# Patient Record
Sex: Female | Born: 1970 | Race: White | Hispanic: No | Smoking: Former smoker
Health system: Southern US, Community
[De-identification: ages and names within clinical notes are randomized; demographics above are authoritative.]

## PROBLEM LIST (undated history)

## (undated) DIAGNOSIS — J45909 Unspecified asthma, uncomplicated: Secondary | ICD-10-CM

## (undated) DIAGNOSIS — I10 Essential (primary) hypertension: Secondary | ICD-10-CM

## (undated) DIAGNOSIS — I340 Nonrheumatic mitral (valve) insufficiency: Secondary | ICD-10-CM

## (undated) DIAGNOSIS — E119 Type 2 diabetes mellitus without complications: Secondary | ICD-10-CM

## (undated) HISTORY — PX: EYE SURGERY: SHX253

---

## 2005-03-05 ENCOUNTER — Emergency Department (HOSPITAL_COMMUNITY): Admission: EM | Admit: 2005-03-05 | Discharge: 2005-03-06 | Payer: Self-pay | Admitting: Emergency Medicine

## 2007-08-15 IMAGING — CR DG KNEE COMPLETE 4+V*R*
4 series · 4 of 4 positions shown · non-contrast
Comparison: none

CLINICAL DATA: injury and pain
 RIGHT KNEE- 4 VIEW:

[view not recorded (1 of 4)]
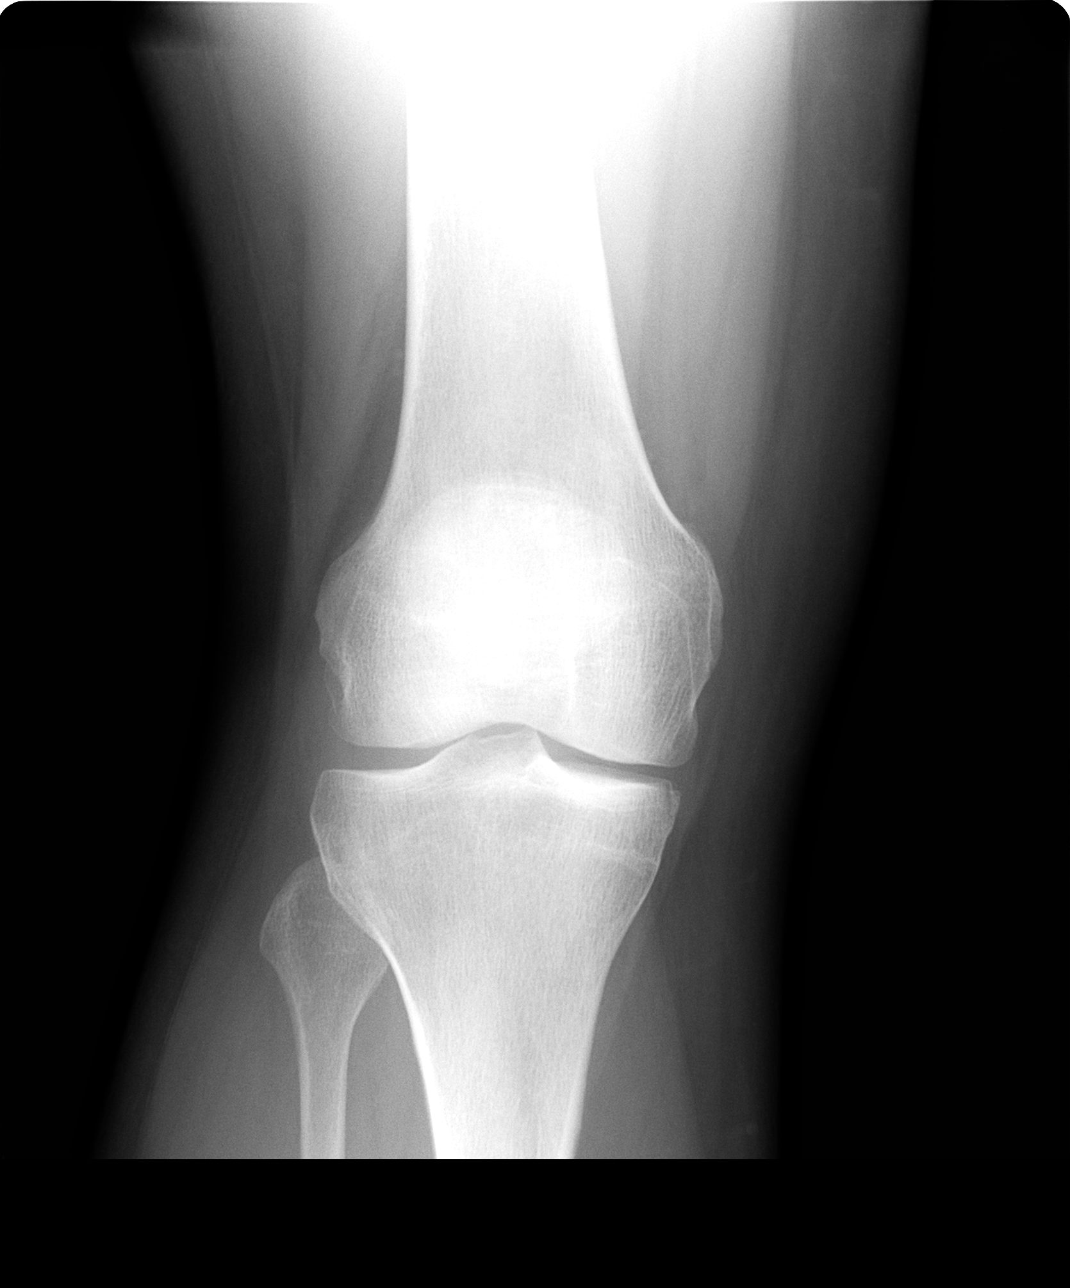

[view not recorded (2 of 4)]
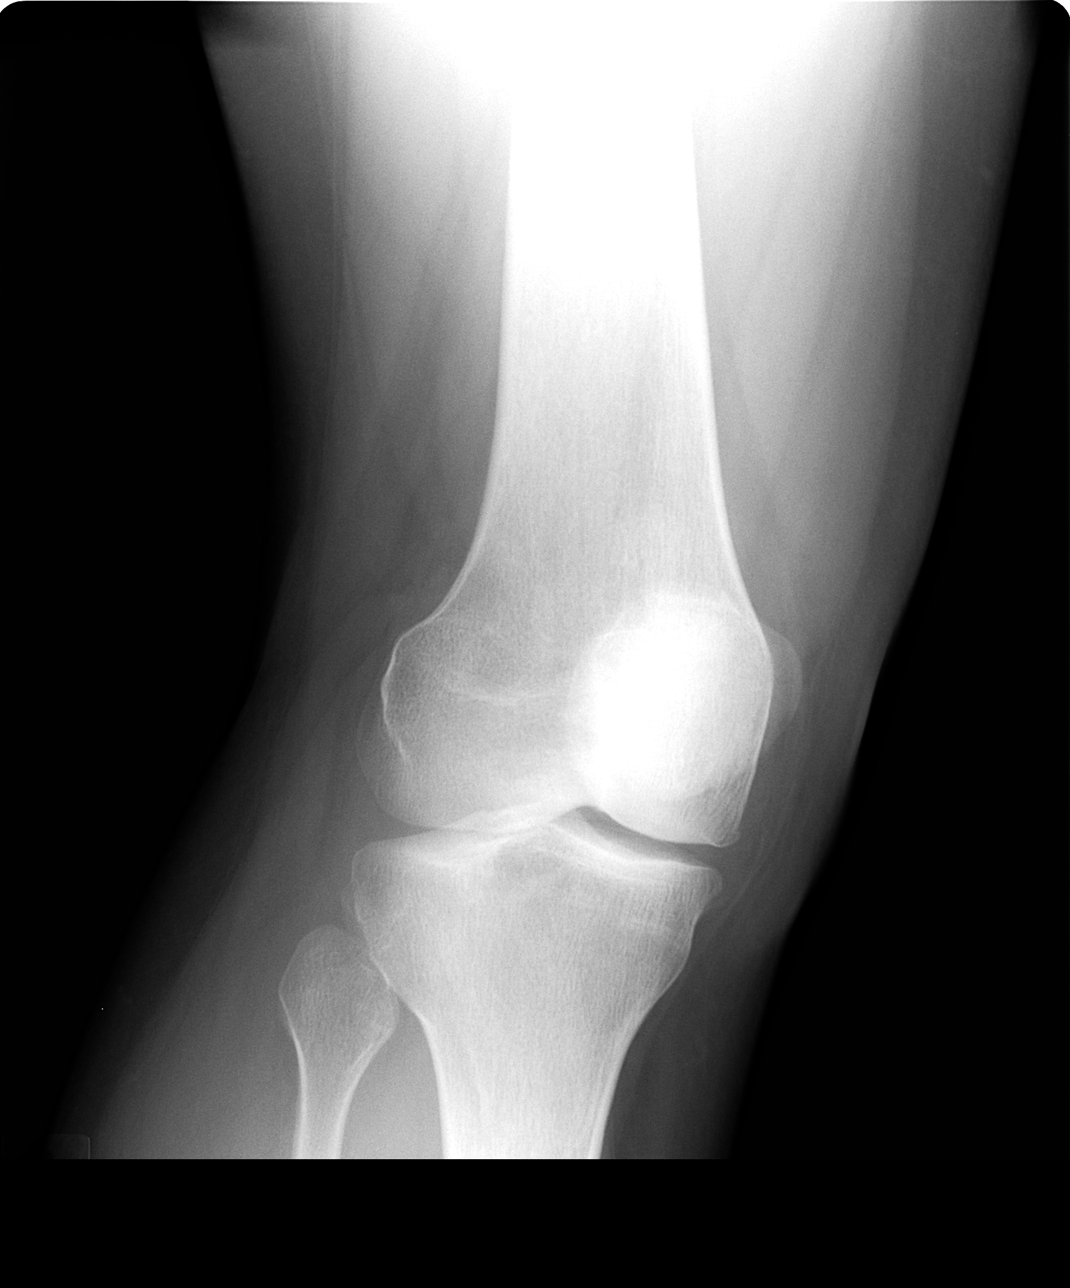

[view not recorded (3 of 4)]
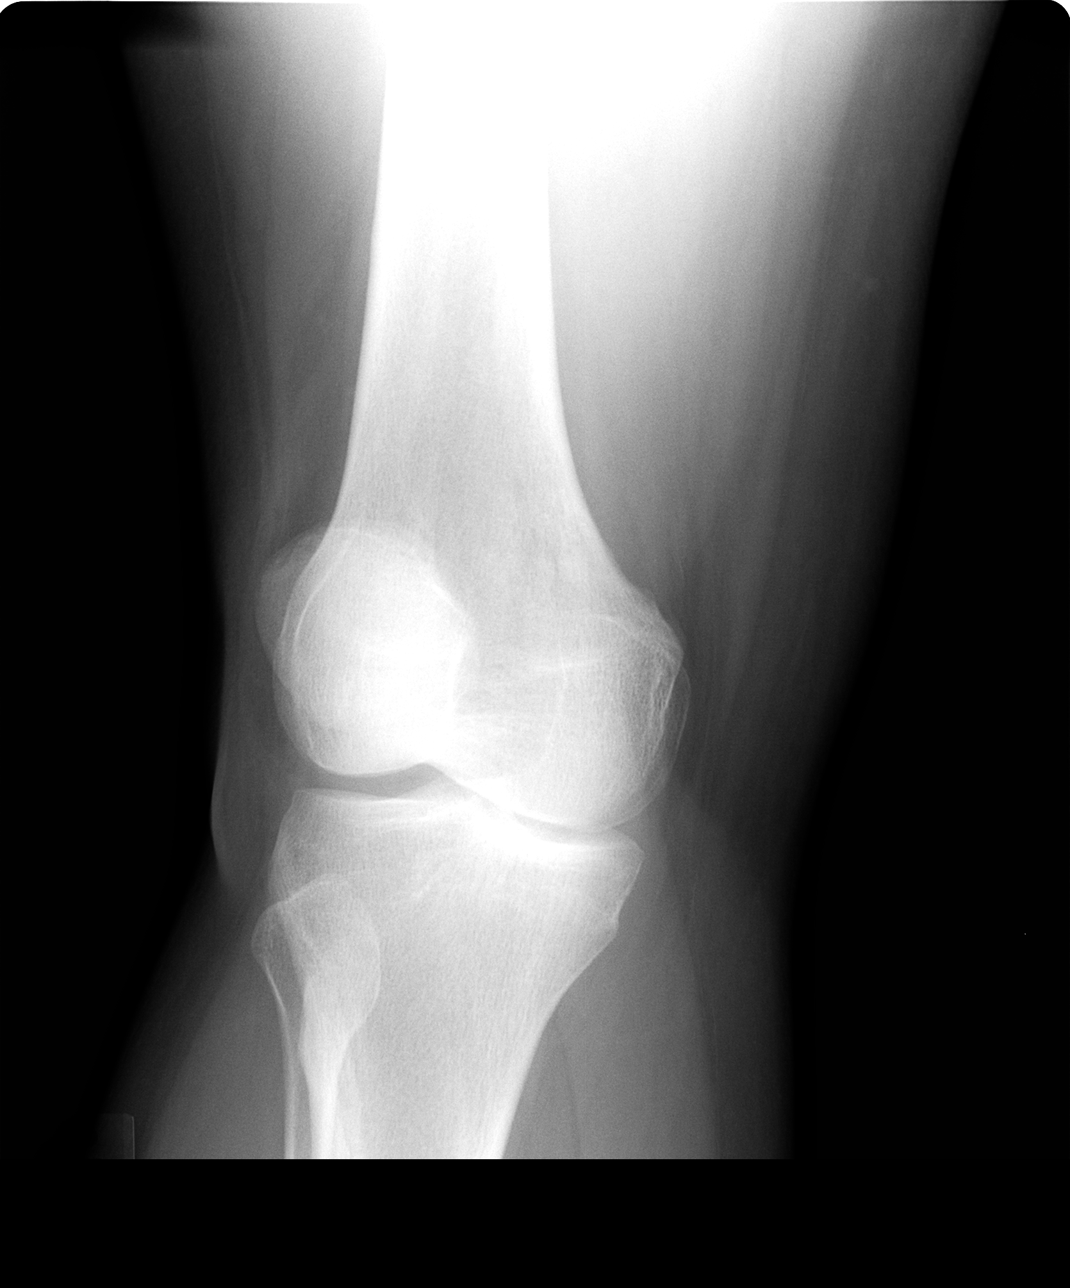

[view not recorded (4 of 4)]
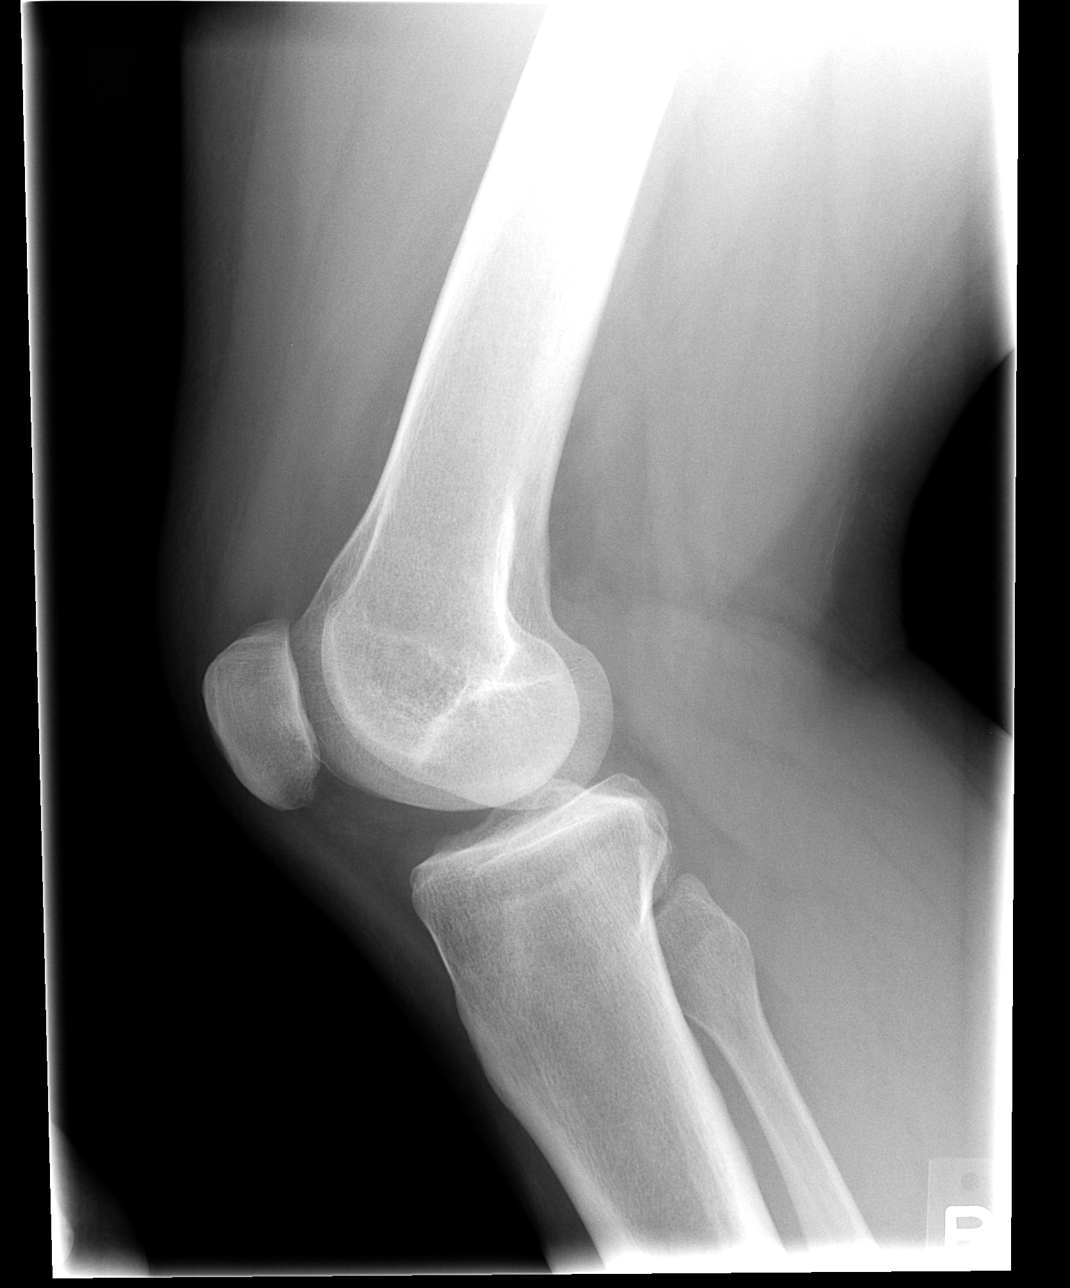

[4 of 4 positions shown; findings below may reference images not displayed]

FINDINGS: No fracture, dislocation or joint effusion.  There is some early degenerative change in the medial compartment.
IMPRESSION: See above.

## 2017-01-30 ENCOUNTER — Encounter
Admission: EM | Disposition: A | Discharge status: Home / Self Care | Payer: Self-pay | Source: Home / Self Care | Attending: Internal Medicine

## 2017-01-30 ENCOUNTER — Emergency Department: Payer: Self-pay

## 2017-01-30 ENCOUNTER — Other Ambulatory Visit: Payer: Self-pay

## 2017-01-30 ENCOUNTER — Inpatient Hospital Stay
Admission: EM | Admit: 2017-01-30 | Discharge: 2017-02-01 | DRG: 281 | Disposition: A | Discharge status: Home / Self Care | Payer: Self-pay | Attending: Internal Medicine | Admitting: Internal Medicine

## 2017-01-30 ENCOUNTER — Encounter: Payer: Self-pay | Admitting: Emergency Medicine

## 2017-01-30 DIAGNOSIS — I119 Hypertensive heart disease without heart failure: Secondary | ICD-10-CM | POA: Diagnosis present

## 2017-01-30 DIAGNOSIS — Z79899 Other long term (current) drug therapy: Secondary | ICD-10-CM

## 2017-01-30 DIAGNOSIS — Z91013 Allergy to seafood: Secondary | ICD-10-CM

## 2017-01-30 DIAGNOSIS — Z87891 Personal history of nicotine dependence: Secondary | ICD-10-CM

## 2017-01-30 DIAGNOSIS — J45909 Unspecified asthma, uncomplicated: Secondary | ICD-10-CM | POA: Diagnosis present

## 2017-01-30 DIAGNOSIS — Z833 Family history of diabetes mellitus: Secondary | ICD-10-CM

## 2017-01-30 DIAGNOSIS — I252 Old myocardial infarction: Secondary | ICD-10-CM

## 2017-01-30 DIAGNOSIS — Z6839 Body mass index (BMI) 39.0-39.9, adult: Secondary | ICD-10-CM

## 2017-01-30 DIAGNOSIS — I209 Angina pectoris, unspecified: Secondary | ICD-10-CM

## 2017-01-30 DIAGNOSIS — Z888 Allergy status to other drugs, medicaments and biological substances status: Secondary | ICD-10-CM

## 2017-01-30 DIAGNOSIS — Z91018 Allergy to other foods: Secondary | ICD-10-CM

## 2017-01-30 DIAGNOSIS — I214 Non-ST elevation (NSTEMI) myocardial infarction: Principal | ICD-10-CM | POA: Diagnosis present

## 2017-01-30 DIAGNOSIS — R739 Hyperglycemia, unspecified: Secondary | ICD-10-CM

## 2017-01-30 DIAGNOSIS — Z880 Allergy status to penicillin: Secondary | ICD-10-CM

## 2017-01-30 DIAGNOSIS — E785 Hyperlipidemia, unspecified: Secondary | ICD-10-CM | POA: Diagnosis present

## 2017-01-30 DIAGNOSIS — Z885 Allergy status to narcotic agent status: Secondary | ICD-10-CM

## 2017-01-30 DIAGNOSIS — Z8249 Family history of ischemic heart disease and other diseases of the circulatory system: Secondary | ICD-10-CM

## 2017-01-30 DIAGNOSIS — Z7982 Long term (current) use of aspirin: Secondary | ICD-10-CM

## 2017-01-30 DIAGNOSIS — E1165 Type 2 diabetes mellitus with hyperglycemia: Secondary | ICD-10-CM | POA: Diagnosis present

## 2017-01-30 DIAGNOSIS — I471 Supraventricular tachycardia: Secondary | ICD-10-CM | POA: Diagnosis present

## 2017-01-30 DIAGNOSIS — Z7984 Long term (current) use of oral hypoglycemic drugs: Secondary | ICD-10-CM

## 2017-01-30 HISTORY — DX: Essential (primary) hypertension: I10

## 2017-01-30 HISTORY — DX: Type 2 diabetes mellitus without complications: E11.9

## 2017-01-30 HISTORY — DX: Unspecified asthma, uncomplicated: J45.909

## 2017-01-30 HISTORY — DX: Nonrheumatic mitral (valve) insufficiency: I34.0

## 2017-01-30 HISTORY — PX: CORONARY ANGIOGRAPHY: CATH118303

## 2017-01-30 LAB — LIPID PANEL
Cholesterol: 190 mg/dL (ref 0–200)
HDL: 30 mg/dL — ABNORMAL LOW (ref >40)
LDL CALC: UNDETERMINED mg/dL (ref 0–99)
TRIGLYCERIDES: 433 mg/dL — AB (ref <150)
Total CHOL/HDL Ratio: 6.3 RATIO
VLDL: UNDETERMINED mg/dL (ref 0–40)

## 2017-01-30 LAB — BASIC METABOLIC PANEL
Anion gap: 10 (ref 5–15)
Anion gap: 8 (ref 5–15)
BUN: 18 mg/dL (ref 6–20)
BUN: 17 mg/dL (ref 6–20)
CALCIUM: 9.5 mg/dL (ref 8.9–10.3)
CHLORIDE: 103 mmol/L (ref 101–111)
CO2: 24 mmol/L (ref 22–32)
CO2: 20 mmol/L — ABNORMAL LOW (ref 22–32)
CREATININE: 0.86 mg/dL (ref 0.44–1.00)
Calcium: 8.8 mg/dL — ABNORMAL LOW (ref 8.9–10.3)
Chloride: 99 mmol/L — ABNORMAL LOW (ref 101–111)
Creatinine, Ser: 0.72 mg/dL (ref 0.44–1.00)
GFR calc Af Amer: >60 mL/min (ref >60)
GFR calc non Af Amer: >60 mL/min (ref >60)
GFR calc non Af Amer: >60 mL/min (ref >60)
Glucose, Bld: 407 mg/dL — ABNORMAL HIGH (ref 65–99)
Glucose, Bld: 418 mg/dL — ABNORMAL HIGH (ref 65–99)
POTASSIUM: 4.1 mmol/L (ref 3.5–5.1)
POTASSIUM: 4.5 mmol/L (ref 3.5–5.1)
SODIUM: 131 mmol/L — AB (ref 135–145)
Sodium: 133 mmol/L — ABNORMAL LOW (ref 135–145)

## 2017-01-30 LAB — TROPONIN I
TROPONIN I: 0.04 ng/mL — AB (ref <0.03)
Troponin I: 0.05 ng/mL (ref <0.03)

## 2017-01-30 LAB — CBC
HCT: 36.6 % (ref 35.0–47.0)
HEMATOCRIT: 34.9 % — AB (ref 35.0–47.0)
Hemoglobin: 11.4 g/dL — ABNORMAL LOW (ref 12.0–16.0)
Hemoglobin: 10.8 g/dL — ABNORMAL LOW (ref 12.0–16.0)
MCH: 20.9 pg — ABNORMAL LOW (ref 26.0–34.0)
MCH: 20.7 pg — ABNORMAL LOW (ref 26.0–34.0)
MCHC: 31.1 g/dL — ABNORMAL LOW (ref 32.0–36.0)
MCHC: 30.9 g/dL — ABNORMAL LOW (ref 32.0–36.0)
MCV: 67.4 fL — ABNORMAL LOW (ref 80.0–100.0)
MCV: 67 fL — AB (ref 80.0–100.0)
PLATELETS: 371 10*3/uL (ref 150–440)
Platelets: 323 10*3/uL (ref 150–440)
RBC: 5.43 MIL/uL — AB (ref 3.80–5.20)
RBC: 5.21 MIL/uL — ABNORMAL HIGH (ref 3.80–5.20)
RDW: 17.5 % — AB (ref 11.5–14.5)
RDW: 17.4 % — AB (ref 11.5–14.5)
WBC: 9.5 10*3/uL (ref 3.6–11.0)
WBC: 7.8 10*3/uL (ref 3.6–11.0)

## 2017-01-30 LAB — APTT: aPTT: 24 seconds (ref 24–36)

## 2017-01-30 LAB — GLUCOSE, CAPILLARY
GLUCOSE-CAPILLARY: 294 mg/dL — AB (ref 65–99)
GLUCOSE-CAPILLARY: 456 mg/dL — AB (ref 65–99)
Glucose-Capillary: 263 mg/dL — ABNORMAL HIGH (ref 65–99)
Glucose-Capillary: 359 mg/dL — ABNORMAL HIGH (ref 65–99)
Glucose-Capillary: 451 mg/dL — ABNORMAL HIGH (ref 65–99)

## 2017-01-30 LAB — PROTIME-INR
INR: 0.97
Prothrombin Time: 12.8 seconds (ref 11.4–15.2)

## 2017-01-30 LAB — POCT PREGNANCY, URINE: PREG TEST UR: NEGATIVE

## 2017-01-30 SURGERY — CORONARY ANGIOGRAPHY (CATH LAB)
Anesthesia: Moderate Sedation

## 2017-01-30 SURGERY — LEFT HEART CATH AND CORONARY ANGIOGRAPHY
Anesthesia: Moderate Sedation | Laterality: Right

## 2017-01-30 MED ORDER — INSULIN GLARGINE 100 UNIT/ML ~~LOC~~ SOLN
10.0000 [IU] | Freq: Every day | SUBCUTANEOUS | Status: DC
  Filled 2017-01-30 (×2): qty 0.1

## 2017-01-30 MED ORDER — DIPHENHYDRAMINE HCL 25 MG PO CAPS: 50.0000 mg | ORAL_CAPSULE | Freq: Two times a day (BID) | ORAL | Status: DC

## 2017-01-30 MED ORDER — ACETAMINOPHEN 325 MG PO TABS
650.0000 mg | ORAL_TABLET | ORAL | Status: DC | PRN
  Administered 2017-01-31: 650 mg via ORAL
  Filled 2017-01-30: qty 2

## 2017-01-30 MED ORDER — METHYLPREDNISOLONE SODIUM SUCC 125 MG IJ SOLR
80.0000 mg | Freq: Once | INTRAMUSCULAR | Status: AC
  Administered 2017-01-30: 125 mg via INTRAVENOUS

## 2017-01-30 MED ORDER — INSULIN ASPART 100 UNIT/ML ~~LOC~~ SOLN
0.0000 [IU] | Freq: Three times a day (TID) | SUBCUTANEOUS | Status: DC
  Administered 2017-01-31 (×2): 9 [IU] via SUBCUTANEOUS
  Filled 2017-01-30 (×2): qty 1

## 2017-01-30 MED ORDER — LABETALOL HCL 5 MG/ML IV SOLN
INTRAVENOUS | Status: AC
  Filled 2017-01-30: qty 4

## 2017-01-30 MED ORDER — SODIUM CHLORIDE 0.9 % IV BOLUS (SEPSIS)
1000.0000 mL | Freq: Once | INTRAVENOUS | Status: AC
  Administered 2017-01-30: 1000 mL via INTRAVENOUS

## 2017-01-30 MED ORDER — HEPARIN (PORCINE) IN NACL 2-0.9 UNIT/ML-% IJ SOLN
INTRAMUSCULAR | Status: AC
  Filled 2017-01-30: qty 500

## 2017-01-30 MED ORDER — SODIUM CHLORIDE 0.9 % WEIGHT BASED INFUSION: 1.0000 mL/kg/h | INTRAVENOUS | Status: DC

## 2017-01-30 MED ORDER — INSULIN GLARGINE 100 UNIT/ML ~~LOC~~ SOLN
10.0000 [IU] | Freq: Every day | SUBCUTANEOUS | Status: DC
  Administered 2017-01-30: 10 [IU] via SUBCUTANEOUS
  Filled 2017-01-30 (×2): qty 0.1

## 2017-01-30 MED ORDER — NITROGLYCERIN 0.4 MG SL SUBL: 0.4000 mg | SUBLINGUAL_TABLET | SUBLINGUAL | Status: DC | PRN

## 2017-01-30 MED ORDER — SODIUM CHLORIDE 0.9 % WEIGHT BASED INFUSION: 3.0000 mL/kg/h | INTRAVENOUS | Status: DC

## 2017-01-30 MED ORDER — FAMOTIDINE 20 MG PO TABS
ORAL_TABLET | ORAL | Status: AC
  Administered 2017-01-30: 20 mg
  Filled 2017-01-30: qty 1

## 2017-01-30 MED ORDER — ASPIRIN 81 MG PO CHEW: 81.0000 mg | CHEWABLE_TABLET | ORAL | Status: DC

## 2017-01-30 MED ORDER — DIPHENHYDRAMINE HCL 50 MG/ML IJ SOLN
INTRAMUSCULAR | Status: AC
  Administered 2017-01-30: 50 mg
  Filled 2017-01-30: qty 1

## 2017-01-30 MED ORDER — LABETALOL HCL 5 MG/ML IV SOLN
INTRAVENOUS | Status: DC | PRN
  Administered 2017-01-30 (×2): 20 mg via INTRAVENOUS

## 2017-01-30 MED ORDER — FAMOTIDINE 20 MG PO TABS
20.0000 mg | ORAL_TABLET | Freq: Once | ORAL | Status: DC
  Filled 2017-01-30: qty 1

## 2017-01-30 MED ORDER — NITROGLYCERIN 0.4 MG SL SUBL
0.4000 mg | SUBLINGUAL_TABLET | SUBLINGUAL | Status: DC | PRN
  Administered 2017-01-30: 0.4 mg via SUBLINGUAL

## 2017-01-30 MED ORDER — MIDAZOLAM HCL 2 MG/2ML IJ SOLN
INTRAMUSCULAR | Status: DC | PRN
  Administered 2017-01-30: 1 mg via INTRAVENOUS

## 2017-01-30 MED ORDER — INSULIN REGULAR HUMAN 100 UNIT/ML IJ SOLN
5.0000 [IU] | Freq: Once | INTRAMUSCULAR | Status: AC
  Administered 2017-01-30: 5 [IU] via INTRAVENOUS
  Filled 2017-01-30: qty 0.05

## 2017-01-30 MED ORDER — KETOROLAC TROMETHAMINE 30 MG/ML IJ SOLN
30.0000 mg | Freq: Three times a day (TID) | INTRAMUSCULAR | Status: DC | PRN
  Administered 2017-01-30 – 2017-02-01 (×3): 30 mg via INTRAVENOUS
  Filled 2017-01-30 (×3): qty 1

## 2017-01-30 MED ORDER — METOPROLOL TARTRATE 5 MG/5ML IV SOLN
5.0000 mg | Freq: Once | INTRAVENOUS | Status: AC
  Administered 2017-01-30: 5 mg via INTRAVENOUS
  Filled 2017-01-30: qty 5

## 2017-01-30 MED ORDER — INSULIN REGULAR HUMAN 100 UNIT/ML IJ SOLN
10.0000 [IU] | Freq: Once | INTRAMUSCULAR | Status: AC
  Administered 2017-01-31: 10 [IU] via INTRAVENOUS
  Filled 2017-01-30: qty 0.1

## 2017-01-30 MED ORDER — ASPIRIN EC 81 MG PO TBEC
81.0000 mg | DELAYED_RELEASE_TABLET | Freq: Every day | ORAL | Status: DC
  Administered 2017-01-31 – 2017-02-01 (×2): 81 mg via ORAL
  Filled 2017-01-30 (×2): qty 1

## 2017-01-30 MED ORDER — INSULIN ASPART 100 UNIT/ML ~~LOC~~ SOLN
0.0000 [IU] | Freq: Three times a day (TID) | SUBCUTANEOUS | Status: DC
  Administered 2017-01-30: 5 [IU] via SUBCUTANEOUS
  Filled 2017-01-30: qty 1

## 2017-01-30 MED ORDER — SODIUM CHLORIDE 0.9 % IV SOLN: 250.0000 mL | INTRAVENOUS | Status: DC | PRN

## 2017-01-30 MED ORDER — FENTANYL CITRATE (PF) 100 MCG/2ML IJ SOLN
INTRAMUSCULAR | Status: AC
  Filled 2017-01-30: qty 2

## 2017-01-30 MED ORDER — METOPROLOL TARTRATE 50 MG PO TABS: 100.0000 mg | ORAL_TABLET | Freq: Two times a day (BID) | ORAL | Status: DC

## 2017-01-30 MED ORDER — HEPARIN BOLUS VIA INFUSION
4000.0000 [IU] | Freq: Once | INTRAVENOUS | Status: AC
  Administered 2017-01-30: 4000 [IU] via INTRAVENOUS
  Filled 2017-01-30: qty 4000

## 2017-01-30 MED ORDER — DOCUSATE SODIUM 100 MG PO CAPS: 100.0000 mg | ORAL_CAPSULE | Freq: Two times a day (BID) | ORAL | Status: DC | PRN

## 2017-01-30 MED ORDER — LIDOCAINE HCL (PF) 1 % IJ SOLN
INTRAMUSCULAR | Status: AC
  Filled 2017-01-30: qty 30

## 2017-01-30 MED ORDER — METHYLPREDNISOLONE SODIUM SUCC 125 MG IJ SOLR
INTRAMUSCULAR | Status: AC
  Filled 2017-01-30: qty 2

## 2017-01-30 MED ORDER — LIDOCAINE HCL (PF) 1 % IJ SOLN
INTRAMUSCULAR | Status: DC | PRN
  Administered 2017-01-30: 20 mL via INTRADERMAL

## 2017-01-30 MED ORDER — HEPARIN SODIUM (PORCINE) 5000 UNIT/ML IJ SOLN: 4000.0000 [IU] | Freq: Once | INTRAMUSCULAR | Status: DC

## 2017-01-30 MED ORDER — NITROGLYCERIN 0.4 MG SL SUBL
SUBLINGUAL_TABLET | SUBLINGUAL | Status: AC
  Administered 2017-01-30: 0.4 mg via SUBLINGUAL
  Filled 2017-01-30: qty 1

## 2017-01-30 MED ORDER — INSULIN GLARGINE 100 UNIT/ML ~~LOC~~ SOLN
10.0000 [IU] | Freq: Every day | SUBCUTANEOUS | Status: DC
  Filled 2017-01-30: qty 0.1

## 2017-01-30 MED ORDER — LISINOPRIL 10 MG PO TABS: 10.0000 mg | ORAL_TABLET | Freq: Every day | ORAL | Status: DC

## 2017-01-30 MED ORDER — FENTANYL CITRATE (PF) 100 MCG/2ML IJ SOLN
INTRAMUSCULAR | Status: DC | PRN
  Administered 2017-01-30: 25 ug via INTRAVENOUS

## 2017-01-30 MED ORDER — IOPAMIDOL (ISOVUE-300) INJECTION 61%
INTRAVENOUS | Status: DC | PRN
  Administered 2017-01-30: 110 mL via INTRA_ARTERIAL

## 2017-01-30 MED ORDER — ASPIRIN 81 MG PO CHEW
324.0000 mg | CHEWABLE_TABLET | Freq: Once | ORAL | Status: AC
  Administered 2017-01-30: 324 mg via ORAL

## 2017-01-30 MED ORDER — DIPHENHYDRAMINE HCL 50 MG/ML IJ SOLN: 50.0000 mg | Freq: Once | INTRAMUSCULAR | Status: DC

## 2017-01-30 MED ORDER — LABETALOL HCL 200 MG PO TABS
200.0000 mg | ORAL_TABLET | Freq: Two times a day (BID) | ORAL | Status: DC
  Administered 2017-01-30 – 2017-02-01 (×4): 200 mg via ORAL
  Filled 2017-01-30 (×6): qty 1

## 2017-01-30 MED ORDER — MIDAZOLAM HCL 2 MG/2ML IJ SOLN
INTRAMUSCULAR | Status: AC
  Filled 2017-01-30: qty 2

## 2017-01-30 MED ORDER — ONDANSETRON HCL 4 MG/2ML IJ SOLN: 4.0000 mg | Freq: Four times a day (QID) | INTRAMUSCULAR | Status: DC | PRN

## 2017-01-30 MED ORDER — ASPIRIN 81 MG PO CHEW
CHEWABLE_TABLET | ORAL | Status: AC
  Filled 2017-01-30: qty 4

## 2017-01-30 MED ORDER — SODIUM CHLORIDE 0.9% FLUSH: 3.0000 mL | INTRAVENOUS | Status: DC | PRN

## 2017-01-30 MED ORDER — SODIUM CHLORIDE 0.9% FLUSH: 3.0000 mL | Freq: Two times a day (BID) | INTRAVENOUS | Status: DC

## 2017-01-30 MED ORDER — SODIUM CHLORIDE 0.9% FLUSH
3.0000 mL | Freq: Two times a day (BID) | INTRAVENOUS | Status: DC
  Administered 2017-01-31 – 2017-02-01 (×3): 3 mL via INTRAVENOUS

## 2017-01-30 MED ORDER — HEPARIN (PORCINE) IN NACL 100-0.45 UNIT/ML-% IJ SOLN
1250.0000 [IU]/h | INTRAMUSCULAR | Status: DC
  Administered 2017-01-30: 1250 [IU]/h via INTRAVENOUS
  Filled 2017-01-30 (×2): qty 250

## 2017-01-30 SURGICAL SUPPLY — 9 items
CATH 5FR JR4 DIAGNOSTIC (CATHETERS) ×2 IMPLANT
CATH INFINITI 5FR ANG PIGTAIL (CATHETERS) ×2 IMPLANT
CATH INFINITI 5FR JL4 (CATHETERS) ×2 IMPLANT
DEVICE CLOSURE MYNXGRIP 5F (Vascular Products) ×3 IMPLANT
KIT MANI 3VAL PERCEP (MISCELLANEOUS) ×3 IMPLANT
NEEDLE PERC 18GX7CM (NEEDLE) ×3 IMPLANT
PACK CARDIAC CATH (CUSTOM PROCEDURE TRAY) ×3 IMPLANT
SHEATH PINNACLE 5F 10CM (SHEATH) ×2 IMPLANT
WIRE EMERALD 3MM-J .035X150CM (WIRE) ×2 IMPLANT

## 2017-01-30 NOTE — Progress Notes (Signed)
Normal coronaries with hypertensive heart disease and will add lisinopril and change metoprolol to labetolol.

## 2017-01-30 NOTE — ED Provider Notes (Signed)
[facility] Emergency Department Provider Note  ____________________________________________  Time seen: Approximately 12:37 PM  I have reviewed the triage vital signs and the nursing notes.   HISTORY  Chief Complaint Chest Pain and Headache    HPI Emily Baxter is a 46 y.o. female with a history of SVT, CAD status post MI, medication noncompliance, HTN, DM, presenting with chest pain.  The patient reports that she was ambulating up the steps holding a small child when she developed heart racing, with a sharp "squeezing" left-sided chest pain which radiated down her arm and up the back of her neck associated with shortness of breath, nausea and vomiting, diaphoresis and dizziness.  EMS was called and the patient was found to be in SVT with a heart rate in the 180s, which slowed down to the low 100s after 6 mg of adenosine.  At this time, the patient continues to have some mild chest discomfort, but it is "much better" than earlier.  This feels similar to the patient's previous MI.  The patient underwent cardiac catheterization in 10/17 prior MI, but did not receive any angioplasty or stenting.  She has not had any of her medications in over a month.  Past Medical History:  Diagnosis Date  . Asthma   . Diabetes mellitus without complication (HCC)   . Hypertension   . MI (mitral incompetence)     There are no active problems to display for this patient.   Past Surgical History:  Procedure Laterality Date  . CESAREAN SECTION    . EYE SURGERY Right       Allergies Coconut oil; Iodine; Keflex [cephalexin]; Penicillins; Shellfish allergy; and Tegretol [carbamazepine]  No family history on file.  Social History Social History   Tobacco Use  . Smoking status: Former Smoker    Last attempt to quit: 07/30/2016    Years since quitting: 0.5  Substance Use Topics  . Alcohol use: No    Frequency: Never  . Drug use: No    Review of  Systems Constitutional: No fever/chills.  Positive lightheadedness and dizziness.  No syncope. Eyes: No visual changes. ENT: No sore throat. No congestion or rhinorrhea. Cardiovascular: +chest pain. +palpitations. Respiratory: +shortness of breath.  No cough. Gastrointestinal: No abdominal pain.  +nausea, +vomiting.  No diarrhea.  No constipation. Genitourinary: Negative for dysuria. Musculoskeletal: Negative for back pain. Skin: Negative for rash. Neurological: Negative for headaches. No focal numbness, tingling or weakness.  Endocrine: + hyperglycemia    ____________________________________________   PHYSICAL EXAM:  VITAL SIGNS: ED Triage Vitals  Enc Vitals Group     BP 01/30/17 1216 (!) 181/102     Pulse Rate 01/30/17 1216 (!) 111     Resp 01/30/17 1216 20     Temp 01/30/17 1216 98.8 F (37.1 C)     Temp Source 01/30/17 1216 Oral     SpO2 01/30/17 1216 98 %     Weight 01/30/17 1217 267 lb (121.1 kg)     Height 01/30/17 1217 5\' 9"  (1.753 m)     Head Circumference --      Peak Flow --      Pain Score 01/30/17 1216 6     Pain Loc --      Pain Edu? --      Excl. in GC? --     Constitutional: Alert and oriented. Well appearing and in no acute distress. Answers questions appropriately. Eyes: Conjunctivae are normal.  EOMI. No scleral icterus.  Positive disconjugate gaze.  Head: Atraumatic. Nose: No congestion/rhinnorhea. Mouth/Throat: Mucous membranes are dry..  Neck: No stridor.  Supple.  No JVD. Cardiovascular: Normal rate, regular rhythm. No murmurs, rubs or gallops.  Respiratory: Normal respiratory effort.  No accessory muscle use or retractions. Lungs CTAB.  No wheezes, rales or ronchi. Gastrointestinal: Obese.  Soft, nontender and nondistended.  No guarding or rebound.  No peritoneal signs. Musculoskeletal: No LE edema. No ttp in the calves or palpable cords.  Negative Homan's sign. Neurologic:  A&Ox3.  Speech is clear.  Face and smile are symmetric.  EOMI.  Moves  all extremities well. Skin:  Skin is warm, dry and intact. No rash noted. Psychiatric: Mood and affect are normal. Speech and behavior are normal.  Normal judgement  ____________________________________________   LABS (all labs ordered are listed, but only abnormal results are displayed)  Labs Reviewed  BASIC METABOLIC PANEL - Abnormal; Notable for the following components:      Result Value   Sodium 133 (*)    Chloride 99 (*)    Glucose, Bld 407 (*)    All other components within normal limits  CBC - Abnormal; Notable for the following components:   RBC 5.43 (*)    Hemoglobin 11.4 (*)    MCV 67.4 (*)    MCH 20.9 (*)    MCHC 31.1 (*)    RDW 17.5 (*)    All other components within normal limits  TROPONIN I - Abnormal; Notable for the following components:   Troponin I 0.04 (*)    All other components within normal limits  GLUCOSE, CAPILLARY - Abnormal; Notable for the following components:   Glucose-Capillary 359 (*)    All other components within normal limits  PROTIME-INR  APTT  HEPARIN LEVEL (UNFRACTIONATED)  POC URINE PREG, ED  POCT PREGNANCY, URINE   ____________________________________________  EKG  ED ECG REPORT I, Rockne MenghiniNorman, Anne-Caroline, the attending physician, personally viewed and interpreted this ECG.   Date: 01/30/2017  EKG Time: 1213  Rate: 113  Rhythm: sinus tachycardia  Axis: normal  Intervals:none  ST&T Change: 1mm ST elevation V2, 0.165mm ST elevation V1. No STEMI but morphology is concerning for ischemia.    This EKG is compared to EMS tracing, which does show almost 3 mm ST elevation in V2, 2 mm ST elevation in V3, 1 mm ST elevation in V1.  This tracing was obtained prior to adenosine administration with tachycardia in the 180s.  Repeat EKG: ED ECG REPORT I, Rockne MenghiniNorman, Anne-Caroline, the attending physician, personally viewed and interpreted this ECG no STEMI.   Date: 01/30/2017  EKG Time: 1238  Rate: 113  Rhythm: sinus tachycardia  Axis:  normal  Intervals:none  ST&T Change: 1 mm ST elevation in V1, V2 and V3.  No STEMI.   ____________________________________________  RADIOLOGY  No results found.  ____________________________________________   PROCEDURES  Procedure(s) performed: None  Procedures  Critical Care performed: Yes, see critical care note(s) ____________________________________________   INITIAL IMPRESSION / ASSESSMENT AND PLAN / ED COURSE  Pertinent labs & imaging results that were available during my care of the patient were reviewed by me and considered in my medical decision making (see chart for details).  46 y.o. female with a history of CAD status post MI, SVT, presenting with chest pain, palpitations, in the setting of rapid heart rate.  Overall, I am concerned about multiple possible etiologies.  It is possible the patient went into SVT because she has been off of her medications, and that this resulted in some  strain from demand ischemia resulting in the ST elevation that I am seeing on the EMS strips.  Here, the patient also does have some ST elevation but it does not meet criteria for STEMI.  However, given her history and her ongoing symptoms, I am concerned about ACS or MI.  She will immediately be given aspirin, metoprolol, and heparinized.  The on-call cardiologist has been immediately consulted.  The patient understands my concerns, and will continue to monitor her.  I reviewed the patient's medical chart.  ----------------------------------------- 1:23 PM on 01/30/2017 -----------------------------------------  The patient has been seen by Dr. Lennette Bihari, who is also concerned about ischemia, and will plan to have cardiac catheterization today although she does not meet criteria to be taken to the catheterization lab emergently as she is not having a STEMI.  The patient has received aspirin, metoprolol and heparin and remains in stable condition.  She has a elevated troponin of 0.04 at this  time, the patient will be admitted for further evaluation and treatment.  CRITICAL CARE Performed by: Rockne Menghini   Total critical care time: 45 minutes  Critical care time was exclusive of separately billable procedures and treating other patients.  Critical care was necessary to treat or prevent imminent or life-threatening deterioration.  Critical care was time spent personally by me on the following activities: development of treatment plan with patient and/or surrogate as well as nursing, discussions with consultants, evaluation of patient's response to treatment, examination of patient, obtaining history from patient or surrogate, ordering and performing treatments and interventions, ordering and review of laboratory studies, ordering and review of radiographic studies, pulse oximetry and re-evaluation of patient's condition.   ____________________________________________  FINAL CLINICAL IMPRESSION(S) / ED DIAGNOSES  Final diagnoses:  SVT (supraventricular tachycardia) (HCC)  NSTEMI (non-ST elevated myocardial infarction) (HCC)  Hyperglycemia         NEW MEDICATIONS STARTED DURING THIS VISIT:  This SmartLink is deprecated. Use AVSMEDLIST instead to display the medication list for a patient.'    Rockne Menghini, MD 01/30/17 1324

## 2017-01-30 NOTE — Progress Notes (Signed)
ANTICOAGULATION CONSULT NOTE - Initial Consult  Pharmacy Consult for heparin drip Indication: chest pain/ACS  Allergies  Allergen Reactions  . Coconut Oil Anaphylaxis  . Iodine Itching  . Keflex [Cephalexin] Itching  . Penicillins Other (See Comments)    Unknown reaction  . Shellfish Allergy Itching  . Tegretol [Carbamazepine] Other (See Comments)    "skin started falling off"    Patient Measurements: Height: 5\' 9"  (175.3 cm) Weight: 267 lb (121.1 kg) IBW/kg (Calculated) : 66.2 Heparin Dosing Weight: 94 kg  Vital Signs: Temp: 98.8 F (37.1 C) (11/29 1216) Temp Source: Oral (11/29 1216) BP: 181/102 (11/29 1216) Pulse Rate: 111 (11/29 1216)  Labs: Recent Labs    01/30/17 1227  HGB 11.4*  HCT 36.6  PLT 371    CrCl cannot be calculated (No order found.).   Medical History: Past Medical History:  Diagnosis Date  . Asthma   . Diabetes mellitus without complication (HCC)   . Hypertension   . MI (mitral incompetence)    Assessment: Pharmacy consult entered to dose and monitor heparin in this 46 year old female for ACS. Baseline labs have been ordered. Patient was not on anticoagulants prior to admission.  Goal of Therapy:  Heparin level 0.3-0.7 units/ml Monitor platelets by anticoagulation protocol: Yes   Plan:  Give 4000 units bolus x 1 Start heparin infusion at 1250 units/hr Check anti-Xa level in 6 hours and daily while on heparin Continue to monitor H&H and platelets  Cindi CarbonMary M Brianca Fortenberry, PharmD, BCPS Clinical Pharmacist 01/30/2017,1:00 PM
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
## 2017-01-30 NOTE — Progress Notes (Signed)
Patient admitted to unit. Oriented to room, call bell, and staff. Bed in lowest position. Fall safety plan reviewed. Full assessment to Epic. Skin assessment verified with Serenity K. RN. Telemetry box verification with tele clerk and Melton Alarierra NT- Box#: [id number]. CAth site stable. Able to sit up at 1730 and get out of bed after 1830 per Avery DennisonBrooke RN, Specials. Will continue to monitor.

## 2017-01-30 NOTE — Consult Note (Signed)
Wilhemena DurieLarissa Torti is a 46 y.o. female  [id number]  Primary Cardiologist: Shia Eber Reason for Consultation:NSTEMI  HPI: [id number] presented with SVT and got adenosine and now in NSR. But had dynamic st and changes associated with chest pai radiating to neck   Review of Systems: NO PND/syncope  Past Medical History:  Diagnosis Date  . Asthma   . Diabetes mellitus without complication (HCC)   . Hypertension   . MI (mitral incompetence)      (Not in a hospital admission)   . diphenhydrAMINE  50 mg Oral q12n4p    Infusions: . heparin 1,250 Units/hr (01/30/17 1310)    Allergies  Allergen Reactions  . Coconut Oil Anaphylaxis  . Iodine Itching  . Keflex [Cephalexin] Itching  . Penicillins Other (See Comments)    Unknown reaction  . Shellfish Allergy Itching  . Tegretol [Carbamazepine] Other (See Comments)    "skin started falling off"    Social History   Socioeconomic History  . Marital status: Divorced    Spouse name: Not on file  . Number of children: Not on file  . Years of education: Not on file  . Highest education level: Not on file  Social Needs  . Financial resource strain: Not on file  . Food insecurity - worry: Not on file  . Food insecurity - inability: Not on file  . Transportation needs - medical: Not on file  . Transportation needs - non-medical: Not on file  Occupational History  . Not on file  Tobacco Use  . Smoking status: Former Smoker    Last attempt to quit: 07/30/2016    Years since quitting: 0.5  Substance and Sexual Activity  . Alcohol use: No    Frequency: Never  . Drug use: No  . Sexual activity: Not Currently  Other Topics Concern  . Not on file  Social History Narrative  . Not on file    No family history on file.  PHYSICAL EXAM: Vitals:   01/30/17 1216  BP: (!) 181/102  Pulse: (!) 111  Resp: 20  Temp: 98.8 F (37.1 C)  SpO2: 98%    No intake or output data in the 24 hours ending 01/30/17 1322  General:  Well  appearing. No respiratory difficulty HEENT: normal Neck: supple. no JVD. Carotids 2+ bilat; no bruits. No lymphadenopathy or thryomegaly appreciated. Cor: PMI nondisplaced. Regular rate & rhythm. No rubs, gallops or murmurs. Lungs: clear Abdomen: soft, nontender, nondistended. No hepatosplenomegaly. No bruits or masses. Good bowel sounds. Extremities: no cyanosis, clubbing, rash, edema Neuro: alert & oriented x 3, cranial nerves grossly intact. moves all 4 extremities w/o difficulty. Affect pleasant.  ECG: nsr WITH ST DEPRESSION IN INFEROLATERAL LEAD AND ST ELEVATION DUE TO LVH IN V1-V2  Results for orders placed or performed during the hospital encounter of 01/30/17 (from the past 24 hour(s))  Basic metabolic panel     Status: Abnormal   Collection Time: 01/30/17 12:27 PM  Result Value Ref Range   Sodium 133 (L) 135 - 145 mmol/L   Potassium 4.1 3.5 - 5.1 mmol/L   Chloride 99 (L) 101 - 111 mmol/L   CO2 24 22 - 32 mmol/L   Glucose, Bld 407 (H) 65 - 99 mg/dL   BUN 18 6 - 20 mg/dL   Creatinine, Ser 4.090.86 0.44 - 1.00 mg/dL   Calcium 9.5 8.9 - 81.110.3 mg/dL   GFR calc non Af Amer >60 >60 mL/min   GFR calc Af Amer >60 >  60 mL/min   Anion gap 10 5 - 15  CBC     Status: Abnormal   Collection Time: 01/30/17 12:27 PM  Result Value Ref Range   WBC 9.5 3.6 - 11.0 K/uL   RBC 5.43 (H) 3.80 - 5.20 MIL/uL   Hemoglobin 11.4 (L) 12.0 - 16.0 g/dL   HCT 82.936.6 56.235.0 - 13.047.0 %   MCV 67.4 (L) 80.0 - 100.0 fL   MCH 20.9 (L) 26.0 - 34.0 pg   MCHC 31.1 (L) 32.0 - 36.0 g/dL   RDW 86.517.5 (H) 78.411.5 - 69.614.5 %   Platelets 371 150 - 440 K/uL  Troponin I     Status: Abnormal   Collection Time: 01/30/17 12:27 PM  Result Value Ref Range   Troponin I 0.04 (HH) <0.03 ng/mL  Glucose, capillary     Status: Abnormal   Collection Time: 01/30/17 12:29 PM  Result Value Ref Range   Glucose-Capillary 359 (H) 65 - 99 mg/dL   Comment 1 Notify RN    Comment 2 Document in Chart   Pregnancy, urine POC     Status: None    Collection Time: 01/30/17  1:06 PM  Result Value Ref Range   Preg Test, Ur NEGATIVE NEGATIVE   No results found.   ASSESSMENT AND PLAN: ACUTE CORONARY SYNDROME AND SVT WITH DYNAMIC EKG CHANGES, ADVISE CATH. Johannah Rozas A

## 2017-01-30 NOTE — Progress Notes (Signed)
CBG of 456 at 2103. MD Anne HahnWillis made aware. 5 units IV Novolin ordered. CBG rechecked at 2330, 451. MD willias made aware and 10 units IV Novolin ordered. Will recheck at 0100.

## 2017-01-30 NOTE — ED Triage Notes (Signed)
Patient from home via [facility]. Reports she woke up this morning at approximately 8 am with left- sided chest pain, nausea, vomiting and headache. Patient reports history of MI with similar symptoms. Upon EMS arrival patient had HR in the 180's. EMS gave 6mg  of adenosine in the field with a conversion to HR of 99. Patient reports history of hypertension and DM. States she has been out of medicine for 2 weeks. EMS reports CBG of 582. Patient alert and oriented x4. Reports decrease in chest pain to 4/10. Denies nausea at this time.

## 2017-01-30 NOTE — Progress Notes (Signed)
Pt refusing lab draws. MD Anne HahnWillis made aware.

## 2017-01-31 ENCOUNTER — Encounter: Payer: Self-pay | Admitting: Cardiovascular Disease

## 2017-01-31 ENCOUNTER — Inpatient Hospital Stay
Admit: 2017-01-31 | Discharge: 2017-01-31 | Disposition: A | Discharge status: Home / Self Care | Payer: Self-pay | Attending: Cardiovascular Disease | Admitting: Cardiovascular Disease

## 2017-01-31 LAB — GLUCOSE, CAPILLARY
GLUCOSE-CAPILLARY: 439 mg/dL — AB (ref 65–99)
Glucose-Capillary: 387 mg/dL — ABNORMAL HIGH (ref 65–99)
Glucose-Capillary: 354 mg/dL — ABNORMAL HIGH (ref 65–99)
Glucose-Capillary: 366 mg/dL — ABNORMAL HIGH (ref 65–99)
Glucose-Capillary: 416 mg/dL — ABNORMAL HIGH (ref 65–99)
Glucose-Capillary: 329 mg/dL — ABNORMAL HIGH (ref 65–99)
Glucose-Capillary: 335 mg/dL — ABNORMAL HIGH (ref 65–99)

## 2017-01-31 LAB — CBC
HCT: 31.6 % — ABNORMAL LOW (ref 35.0–47.0)
Hemoglobin: 9.7 g/dL — ABNORMAL LOW (ref 12.0–16.0)
MCH: 20.7 pg — AB (ref 26.0–34.0)
MCHC: 30.8 g/dL — AB (ref 32.0–36.0)
MCV: 67.1 fL — AB (ref 80.0–100.0)
PLATELETS: 323 10*3/uL (ref 150–440)
RBC: 4.7 MIL/uL (ref 3.80–5.20)
RDW: 17.5 % — AB (ref 11.5–14.5)
WBC: 12.1 10*3/uL — AB (ref 3.6–11.0)

## 2017-01-31 LAB — BASIC METABOLIC PANEL
ANION GAP: 9 (ref 5–15)
BUN: 20 mg/dL (ref 6–20)
CALCIUM: 8.7 mg/dL — AB (ref 8.9–10.3)
CO2: 22 mmol/L (ref 22–32)
CREATININE: 0.95 mg/dL (ref 0.44–1.00)
Chloride: 100 mmol/L — ABNORMAL LOW (ref 101–111)
GFR calc Af Amer: >60 mL/min (ref >60)
GLUCOSE: 448 mg/dL — AB (ref 65–99)
Potassium: 4.8 mmol/L (ref 3.5–5.1)
Sodium: 131 mmol/L — ABNORMAL LOW (ref 135–145)

## 2017-01-31 LAB — HEMOGLOBIN A1C
HEMOGLOBIN A1C: 12.3 % — AB (ref 4.8–5.6)
MEAN PLASMA GLUCOSE: 306.31 mg/dL

## 2017-01-31 LAB — ECHOCARDIOGRAM COMPLETE
HEIGHTINCHES: 69 in
WEIGHTICAEL: 4272 [oz_av]

## 2017-01-31 MED ORDER — INSULIN ASPART 100 UNIT/ML ~~LOC~~ SOLN
5.0000 [IU] | Freq: Three times a day (TID) | SUBCUTANEOUS | Status: DC
  Administered 2017-01-31 – 2017-02-01 (×2): 5 [IU] via SUBCUTANEOUS
  Filled 2017-01-31 (×2): qty 1

## 2017-01-31 MED ORDER — INSULIN GLARGINE 100 UNIT/ML ~~LOC~~ SOLN
28.0000 [IU] | Freq: Every day | SUBCUTANEOUS | Status: DC
  Administered 2017-02-01: 28 [IU] via SUBCUTANEOUS
  Filled 2017-01-31: qty 0.28

## 2017-01-31 MED ORDER — SODIUM CHLORIDE 0.9 % IV SOLN
INTRAVENOUS | Status: DC
  Administered 2017-01-31: 03:00:00 via INTRAVENOUS

## 2017-01-31 MED ORDER — INSULIN ASPART 100 UNIT/ML ~~LOC~~ SOLN
0.0000 [IU] | Freq: Every day | SUBCUTANEOUS | Status: DC
  Administered 2017-01-31: 4 [IU] via SUBCUTANEOUS
  Filled 2017-01-31: qty 1

## 2017-01-31 MED ORDER — INSULIN GLARGINE 100 UNIT/ML ~~LOC~~ SOLN
24.0000 [IU] | Freq: Every day | SUBCUTANEOUS | Status: DC
  Administered 2017-01-31: 24 [IU] via SUBCUTANEOUS
  Filled 2017-01-31: qty 0.24

## 2017-01-31 MED ORDER — LIVING WELL WITH DIABETES BOOK
Freq: Once | Status: AC
  Administered 2017-01-31: 15:00:00
  Filled 2017-01-31: qty 1

## 2017-01-31 MED ORDER — INSULIN ASPART 100 UNIT/ML ~~LOC~~ SOLN
0.0000 [IU] | Freq: Three times a day (TID) | SUBCUTANEOUS | Status: DC
  Administered 2017-01-31: 15 [IU] via SUBCUTANEOUS
  Administered 2017-02-01: 11 [IU] via SUBCUTANEOUS
  Filled 2017-01-31 (×2): qty 1

## 2017-01-31 MED ORDER — HYDRALAZINE HCL 50 MG PO TABS
50.0000 mg | ORAL_TABLET | Freq: Two times a day (BID) | ORAL | Status: DC
  Administered 2017-01-31 – 2017-02-01 (×3): 50 mg via ORAL
  Filled 2017-01-31 (×3): qty 1

## 2017-01-31 MED ORDER — LORATADINE 10 MG PO TABS
10.0000 mg | ORAL_TABLET | Freq: Every day | ORAL | Status: DC
  Administered 2017-01-31 – 2017-02-01 (×2): 10 mg via ORAL
  Filled 2017-01-31 (×2): qty 1

## 2017-01-31 MED ORDER — LORATADINE 10 MG PO TABS: 10.0000 mg | ORAL_TABLET | Freq: Every day | ORAL | Status: DC

## 2017-01-31 MED ORDER — ATORVASTATIN CALCIUM 20 MG PO TABS
40.0000 mg | ORAL_TABLET | Freq: Every day | ORAL | Status: DC
  Administered 2017-01-31: 40 mg via ORAL
  Filled 2017-01-31: qty 2

## 2017-01-31 NOTE — Progress Notes (Signed)
*  PRELIMINARY RESULTS* Echocardiogram 2D Echocardiogram has been performed.  Cristela BlueHege, Kavir Savoca 01/31/2017, 8:27 AM

## 2017-01-31 NOTE — Plan of Care (Signed)
  Progressing Clinical Measurements: Will remain free from infection 01/31/2017 1822 - Progressing by Darletta MollWilliams, Tascha Casares, RN Activity: Risk for activity intolerance will decrease 01/31/2017 1822 - Progressing by Darletta MollWilliams, Mikelle Myrick, RN Coping: Level of anxiety will decrease 01/31/2017 1822 - Progressing by Darletta MollWilliams, Athalia Setterlund, RN Safety: Ability to remain free from injury will improve 01/31/2017 1822 - Progressing by Darletta MollWilliams, Karisha Marlin, RN

## 2017-01-31 NOTE — Progress Notes (Addendum)
Inpatient Diabetes Program Recommendations  AACE/ADA: New Consensus Statement on Inpatient Glycemic Control (2015)  Target Ranges:  Prepandial:   less than 140 mg/dL      Peak postprandial:   less than 180 mg/dL (1-2 hours)      Critically ill patients:  140 - 180 mg/dL   Lab Results  Component Value Date   GLUCAP 387 (H) 01/31/2017   HGBA1C 12.3 (H) 01/30/2017    Review of Glycemic Control  Results for [name], Emily (MRN [id number]) as of 01/31/2017 07:57  Ref. Range 01/30/2017 18:03 01/30/2017 21:03 01/30/2017 23:32 01/31/2017 02:12 01/31/2017 06:04  Glucose-Capillary Latest Ref Range: 65 - 99 mg/dL 782294 (H) 956456 (H) 213451 (H) 439 (H) 387 (H)    Diabetes history: Type 2   Outpatient Diabetes medications: Metformin 500mg  QID, Levemir 25 units qhs- confirmed  Current orders for Inpatient glycemic control: Lantus 10 units qhs, Novolog 0-9 units tid  Inpatient Diabetes Program Recommendations: Consider starting Lantus 24 units qam (0.2units/kg) (d/c Lantus 10 units/hs), consider increasing Novolog correction to resistant 0-20 units tid.  Add Novolog 0-5 units qhs.   Please add Novolog 5 units tid.   Spoke to patient regarding home diabetes medications- she confirms she was taking Metformin 500mg  qid and Levemir pen 25 units qhs until 1 month ago.  I discussed the recent A1C of 12.3%- reviewed the significance and the high risk of developing complications if she does not get it under control.    The patient does not have health insurance and will need to get her medications from [facility]- she has been given the paperwork and confirms she will follow up with them.   Susette RacerJulie Craven Crean, RN, BA, MHA, CDE Diabetes Coordinator Inpatient Diabetes Program  [phone] (Team Pager) [phone] [facility] Office) 01/31/2017 7:59 AM

## 2017-01-31 NOTE — Progress Notes (Addendum)
[facility] - [place] at [facility]   PATIENT NAME: Emily Baxter    MR#:  [id number]  DATE OF BIRTH:  11/04/1970  SUBJECTIVE:  CHIEF COMPLAINT:   Chief Complaint  Patient presents with  . Chest Pain  . Headache   No chest pain has high sugar more than 400. REVIEW OF SYSTEMS:  Review of Systems  Constitutional: Negative for chills, fever and malaise/fatigue.  HENT: Negative for sore throat.   Eyes: Negative for blurred vision and double vision.  Respiratory: Negative for cough, hemoptysis, shortness of breath, wheezing and stridor.   Cardiovascular: Negative for chest pain, palpitations, orthopnea and leg swelling.  Gastrointestinal: Negative for abdominal pain, blood in stool, diarrhea, melena, nausea and vomiting.  Genitourinary: Negative for dysuria, flank pain and hematuria.  Musculoskeletal: Negative for back pain and joint pain.  Neurological: Negative for dizziness, sensory change, focal weakness, seizures, loss of consciousness, weakness and headaches.  Endo/Heme/Allergies: Negative for polydipsia.  Psychiatric/Behavioral: Negative for depression. The patient is not nervous/anxious.     DRUG ALLERGIES:   Allergies  Allergen Reactions  . Coconut Oil Anaphylaxis  . Codeine   . Iodine Itching  . Keflex [Cephalexin] Itching  . Penicillins Other (See Comments)    Unknown reaction  . Shellfish Allergy Itching  . Tegretol [Carbamazepine] Other (See Comments)    "skin started falling off"   VITALS:  Blood pressure (!) 134/55, pulse 90, temperature 97.9 F (36.6 C), temperature source Oral, resp. rate 18, height 5\' 9"  (1.753 m), weight 267 lb (121.1 kg), last menstrual period 01/16/2017, SpO2 100 %. PHYSICAL EXAMINATION:  Physical Exam  Constitutional: She is oriented to person, place, and time and well-developed, well-nourished, and in no distress.  Morbid obesity.  HENT:  Head: Normocephalic.  Mouth/Throat: Oropharynx is clear and moist.    Eyes: Conjunctivae and EOM are normal. Pupils are equal, round, and reactive to light. No scleral icterus.  Neck: Normal range of motion. Neck supple. No JVD present. No tracheal deviation present.  Cardiovascular: Normal rate, regular rhythm and normal heart sounds. Exam reveals no gallop.  No murmur heard. Pulmonary/Chest: Effort normal and breath sounds normal. No respiratory distress. She has no wheezes. She has no rales.  Abdominal: Soft. Bowel sounds are normal. She exhibits no distension. There is no tenderness. There is no rebound.  Musculoskeletal: Normal range of motion. She exhibits no edema or tenderness.  Neurological: She is alert and oriented to person, place, and time. No cranial nerve deficit.  Skin: No rash noted. No erythema.  Psychiatric: Affect normal.   LABORATORY PANEL:  Female CBC Recent Labs  Lab 01/31/17 1019  WBC 12.1*  HGB 9.7*  HCT 31.6*  PLT 323   ------------------------------------------------------------------------------------------------------------------ Chemistries  Recent Labs  Lab 01/31/17 1019  NA 131*  K 4.8  CL 100*  CO2 22  GLUCOSE 448*  BUN 20  CREATININE 0.95  CALCIUM 8.7*   RADIOLOGY:  No results found. ASSESSMENT AND PLAN:   * NSTEMI   SVT   Monitor on tele, serial troponin, Echo: EF 55%.   Check lipid: HLP and HBA1c 12.3   She was on Heparin drip, status post cardiac cath which is unremarkable.    Resumed atenolol, ASA. Add lipitor.  *Hyperglycemia with DM2 Increase lantus to 28 units daily, add novolog 5 units AC, increase to resistant scale.  * Htn Continue labetalol, and added hydralazine.  * Asthma    No wheezing. cont Albuterol rescue inhaler prn.  Morbid obesity.  All the records are reviewed and case discussed with Care Management/Social Worker. Management plans discussed with the patient, family and they are in agreement.  CODE STATUS: Full Code  TOTAL TIME TAKING CARE OF THIS PATIENT: 36  minutes.   More than 50% of the time was spent in counseling/coordination of care: YES  POSSIBLE D/C IN 2 DAYS, DEPENDING ON CLINICAL CONDITION.   Shaune PollackQing Javionna Leder M.D on 01/31/2017 at 3:10 PM  Between 7am to 6pm - Pager - [phone]  After 6pm go to [email or web address] - Therapist, nutritionalpassword EPAS [facility]  [facility] [place] [facility]

## 2017-01-31 NOTE — Progress Notes (Signed)
Pt blood sugar 439. MD Sheryle Hailiamond made aware. New order NS at 150/h.
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
## 2017-01-31 NOTE — Progress Notes (Signed)
SUBJECTIVE: Patient is feeling much better denies any further chest pain   Vitals:   01/30/17 1715 01/30/17 1937 01/31/17 0555 01/31/17 0736  BP: (!) 171/96 (!) 175/90 (!) 179/83 (!) 176/89  Pulse: 82 88 78 80  Resp: 17  18 18   Temp:  98.6 F (37 C) (!) 97.5 F (36.4 C) 97.9 F (36.6 C)  TempSrc:  Oral Oral Oral  SpO2: 93% 96% 99% 100%  Weight:      Height:        Intake/Output Summary (Last 24 hours) at 01/31/2017 0849 Last data filed at 01/31/2017 0403 Gross per 24 hour  Intake 1425 ml  Output 1200 ml  Net 225 ml    LABS: Basic Metabolic Panel: Recent Labs    01/30/17 1227 01/30/17 1918  NA 133* 131*  K 4.1 4.5  CL 99* 103  CO2 24 20*  GLUCOSE 407* 418*  BUN 18 17  CREATININE 0.86 0.72  CALCIUM 9.5 8.8*   Liver Function Tests: No results for input(s): AST, ALT, ALKPHOS, BILITOT, PROT, ALBUMIN in the last 72 hours. No results for input(s): LIPASE, AMYLASE in the last 72 hours. CBC: Recent Labs    01/30/17 1227 01/30/17 1918  WBC 9.5 7.8  HGB 11.4* 10.8*  HCT 36.6 34.9*  MCV 67.4* 67.0*  PLT 371 323   Cardiac Enzymes: Recent Labs    01/30/17 1227 01/30/17 1918  TROPONINI 0.04* 0.05*   BNP: Invalid input(s): POCBNP D-Dimer: No results for input(s): DDIMER in the last 72 hours. Hemoglobin A1C: Recent Labs    01/30/17 1918  HGBA1C 12.3*   Fasting Lipid Panel: Recent Labs    01/30/17 1918  CHOL 190  HDL 30*  LDLCALC UNABLE TO CALCULATE IF TRIGLYCERIDE OVER 400 mg/dL  TRIG 161433*  CHOLHDL 6.3   Thyroid Function Tests: No results for input(s): TSH, T4TOTAL, T3FREE, THYROIDAB in the last 72 hours.  Invalid input(s): FREET3 Anemia Panel: No results for input(s): VITAMINB12, FOLATE, FERRITIN, TIBC, IRON, RETICCTPCT in the last 72 hours.   PHYSICAL EXAM General: Well developed, well nourished, in no acute distress HEENT:  Normocephalic and atramatic Neck:  No JVD.  Lungs: Clear bilaterally to auscultation and percussion. Heart: HRRR .  Normal S1 and S2 without gallops or murmurs.  Abdomen: Bowel sounds are positive, abdomen soft and non-tender  Msk:  Back normal, normal gait. Normal strength and tone for age. Extremities: No clubbing, cyanosis or edema.   Neuro: Alert and oriented X 3. Psych:  Good affect, responds appropriately  TELEMETRY: Sinus rhythm  ASSESSMENT AND PLAN: Normal coronaries with normal left reticular systolic function on echocardiogram approximately 55% with mild diastolic dysfunction and LVH. Blood pressure is still high and will add hydralazine 50 twice a day and can be increased to 100 twice a day if needed. Patient probably can be discharged with follow-up in the office this coming Monday at 10 AM.  Principal Problem:   Ischemic chest pain Active Problems:   NSTEMI (non-ST elevated myocardial infarction) (HCC)    Laurier NancyKHAN,Irbin Fines A, MD, [facility] 01/31/2017 8:49 AM

## 2017-02-01 LAB — HIV ANTIBODY (ROUTINE TESTING W REFLEX): HIV Screen 4th Generation wRfx: NONREACTIVE

## 2017-02-01 LAB — GLUCOSE, CAPILLARY
GLUCOSE-CAPILLARY: 263 mg/dL — AB (ref 65–99)
Glucose-Capillary: 254 mg/dL — ABNORMAL HIGH (ref 65–99)

## 2017-02-01 MED ORDER — LABETALOL HCL 200 MG PO TABS: 200.0000 mg | ORAL_TABLET | Freq: Two times a day (BID) | ORAL | 1 refills | Status: AC

## 2017-02-01 MED ORDER — ATORVASTATIN CALCIUM 40 MG PO TABS: 40.0000 mg | ORAL_TABLET | Freq: Every day | ORAL | 1 refills | Status: AC

## 2017-02-01 MED ORDER — INSULIN GLARGINE 100 UNIT/ML ~~LOC~~ SOLN: 28.0000 [IU] | Freq: Every day | SUBCUTANEOUS | 1 refills | Status: AC

## 2017-02-01 MED ORDER — METFORMIN HCL 1000 MG PO TABS: 500.0000 mg | ORAL_TABLET | Freq: Four times a day (QID) | ORAL | 1 refills | Status: AC

## 2017-02-01 MED ORDER — HYDRALAZINE HCL 50 MG PO TABS: 50.0000 mg | ORAL_TABLET | Freq: Two times a day (BID) | ORAL | 1 refills | Status: AC

## 2017-02-01 MED ORDER — ASPIRIN EC 81 MG PO TBEC: 81.0000 mg | DELAYED_RELEASE_TABLET | Freq: Every day | ORAL | 2 refills | Status: AC

## 2017-02-01 MED ORDER — ASPIRIN-ACETAMINOPHEN-CAFFEINE 250-250-65 MG PO TABS: 2.0000 | ORAL_TABLET | Freq: Four times a day (QID) | ORAL | 0 refills | Status: AC | PRN

## 2017-02-01 NOTE — Discharge Instructions (Signed)
Heart healthy and ADA diet. °

## 2017-02-01 NOTE — Progress Notes (Signed)
SUBJECTIVE: Feeling much better denies any chest pain   Vitals:   01/31/17 1639 01/31/17 2018 02/01/17 0650 02/01/17 0833  BP: (!) 155/77 (!) 148/74 (!) 161/86 (!) 147/89  Pulse: 87 92 86 85  Resp: 18 18 18 16   Temp: 98.3 F (36.8 C) 98.1 F (36.7 C) 97.6 F (36.4 C) 98.5 F (36.9 C)  TempSrc: Oral Oral Oral Oral  SpO2: 100% 100% 98% 100%  Weight:      Height:        Intake/Output Summary (Last 24 hours) at 02/01/2017 0841 Last data filed at 01/31/2017 1855 Gross per 24 hour  Intake 720 ml  Output 1000 ml  Net -280 ml    LABS: Basic Metabolic Panel: Recent Labs    01/30/17 1918 01/31/17 1019  NA 131* 131*  K 4.5 4.8  CL 103 100*  CO2 20* 22  GLUCOSE 418* 448*  BUN 17 20  CREATININE 0.72 0.95  CALCIUM 8.8* 8.7*   Liver Function Tests: No results for input(s): AST, ALT, ALKPHOS, BILITOT, PROT, ALBUMIN in the last 72 hours. No results for input(s): LIPASE, AMYLASE in the last 72 hours. CBC: Recent Labs    01/30/17 1918 01/31/17 1019  WBC 7.8 12.1*  HGB 10.8* 9.7*  HCT 34.9* 31.6*  MCV 67.0* 67.1*  PLT 323 323   Cardiac Enzymes: Recent Labs    01/30/17 1227 01/30/17 1918  TROPONINI 0.04* 0.05*   BNP: Invalid input(s): POCBNP D-Dimer: No results for input(s): DDIMER in the last 72 hours. Hemoglobin A1C: Recent Labs    01/30/17 1918  HGBA1C 12.3*   Fasting Lipid Panel: Recent Labs    01/30/17 1918  CHOL 190  HDL 30*  LDLCALC UNABLE TO CALCULATE IF TRIGLYCERIDE OVER 400 mg/dL  TRIG 161433*  CHOLHDL 6.3   Thyroid Function Tests: No results for input(s): TSH, T4TOTAL, T3FREE, THYROIDAB in the last 72 hours.  Invalid input(s): FREET3 Anemia Panel: No results for input(s): VITAMINB12, FOLATE, FERRITIN, TIBC, IRON, RETICCTPCT in the last 72 hours.   PHYSICAL EXAM General: Well developed, well nourished, in no acute distress HEENT:  Normocephalic and atramatic Neck:  No JVD.  Lungs: Clear bilaterally to auscultation and percussion. Heart:  HRRR . Normal S1 and S2 without gallops or murmurs.  Abdomen: Bowel sounds are positive, abdomen soft and non-tender  Msk:  Back normal, normal gait. Normal strength and tone for age. Extremities: No clubbing, cyanosis or edema.   Neuro: Alert and oriented X 3. Psych:  Good affect, responds appropriately  TELEMETRY: Sinus rhythm  ASSESSMENT AND PLAN: Normal coronaries and noncardiac chest pain with uncontrolled hypertension. Patient is still here because of hyperglycemia and blood pressure is improving significantly with current medications. Advise continuing current medication with follow-up in the office Monday.  Principal Problem:   Ischemic chest pain Active Problems:   NSTEMI (non-ST elevated myocardial infarction) (HCC)    Adrian BlackwaterKHAN,Givanni Staron A, MD, [facility] 02/01/2017 8:41 AM

## 2017-02-01 NOTE — Care Management Note (Signed)
Case Management Note  Patient Details  Name: Wilhemena DurieLarissa Utke MRN: [id number] Date of Birth: 06-Sep-1970  Subjective/Objective:   MATCH and applications given .               Action/Plan:   Expected Discharge Date:  02/01/17               Expected Discharge Plan:     In-House Referral:     Discharge planning Services     Post Acute Care Choice:    Choice offered to:     DME Arranged:    DME Agency:     HH Arranged:    HH Agency:     Status of Service:     If discussed at [employer] Length of [employer] Meetings, dates discussed:    Additional Comments:  Catina Nuss A, RN 02/01/2017, 11:35 AM

## 2017-02-01 NOTE — Progress Notes (Signed)
Pt discharged to home. Ambulatory down to lobby. VSS, Iv  & heart monitor removed. Discharge instructions and education reviewed.

## 2017-02-01 NOTE — Discharge Summary (Signed)
[facility] - [place] at [facility]   PATIENT NAME: Emily Baxter    MR#:  [id number]  DATE OF BIRTH:  28-Jun-1970  DATE OF ADMISSION:  01/30/2017   ADMITTING PHYSICIAN: Altamese DillingVaibhavkumar Vachhani, MD  DATE OF DISCHARGE: 02/01/2017  PRIMARY CARE PHYSICIAN: Patient, No Pcp Per   ADMISSION DIAGNOSIS:  SVT (supraventricular tachycardia) (HCC) [I47.1] Hyperglycemia [R73.9] NSTEMI (non-ST elevated myocardial infarction) (HCC) [I21.4] DISCHARGE DIAGNOSIS:  Principal Problem:   Ischemic chest pain Active Problems:   NSTEMI (non-ST elevated myocardial infarction) (HCC)  SECONDARY DIAGNOSIS:   Past Medical History:  Diagnosis Date  . Asthma   . Diabetes mellitus without complication (HCC)   . Hypertension   . MI (mitral incompetence)    HOSPITAL COURSE:   * NSTEMI SVT  Echo: EF 55%. lipid: HLP and HBA1c 12.3 She was on Heparin drip, status post cardiac cath which is unremarkable. Resumed atenolol, ASA. Added lipitor.  *Hyperglycemia with DM2 Increase lantus to 28 units daily, added novolog 5 units AC, increase to resistant scale. Resume home metformin after discharge.  * Htn Continue labetalol, and added hydralazine.  * Asthma No wheezing. cont Albuterol rescue inhaler prn. DISCHARGE CONDITIONS:  Stable, discharged to home today. CONSULTS OBTAINED:  Treatment Team:  Laurier NancyKhan, Shaukat A, MD Shaune Pollackhen, Ahmyah Gidley, MD DRUG ALLERGIES:   Allergies  Allergen Reactions  . Coconut Oil Anaphylaxis    All forms of coconut   . Codeine   . Iodine Itching  . Keflex [Cephalexin] Itching  . Penicillins Other (See Comments)    Unknown reaction  . Shellfish Allergy Itching  . Tegretol [Carbamazepine] Other (See Comments)    "skin started falling off"   DISCHARGE MEDICATIONS:   Allergies as of 02/01/2017      Reactions   Coconut Oil Anaphylaxis   All forms of coconut    Codeine    Iodine Itching   Keflex [cephalexin] Itching   Penicillins Other (See  Comments)   Unknown reaction   Shellfish Allergy Itching   Tegretol [carbamazepine] Other (See Comments)   "skin started falling off"      Medication List    STOP taking these medications   metoprolol tartrate 100 MG tablet Commonly known as:  LOPRESSOR     TAKE these medications   aspirin EC 81 MG tablet Take 1 tablet (81 mg total) by mouth daily.   aspirin-acetaminophen-caffeine 250-250-65 MG tablet Commonly known as:  EXCEDRIN MIGRAINE Take 2 tablets by mouth every 6 (six) hours as needed for headache.   atorvastatin 40 MG tablet Commonly known as:  LIPITOR Take 1 tablet (40 mg total) by mouth daily at 6 PM.   hydrALAZINE 50 MG tablet Commonly known as:  APRESOLINE Take 1 tablet (50 mg total) by mouth 2 (two) times daily.   insulin glargine 100 UNIT/ML injection Commonly known as:  LANTUS Inject 0.28 mLs (28 Units total) into the skin daily.   labetalol 200 MG tablet Commonly known as:  NORMODYNE Take 1 tablet (200 mg total) by mouth 2 (two) times daily.   metFORMIN 1000 MG tablet Commonly known as:  GLUCOPHAGE Take 0.5 tablets (500 mg total) by mouth 4 (four) times daily.        DISCHARGE INSTRUCTIONS:  See AVS, If you experience worsening of your admission symptoms, develop shortness of breath, life threatening emergency, suicidal or homicidal thoughts you must seek medical attention immediately by calling 911 or calling your MD immediately  if symptoms less severe.  You Must read complete  instructions/literature along with all the possible adverse reactions/side effects for all the Medicines you take and that have been prescribed to you. Take any new Medicines after you have completely understood and accpet all the possible adverse reactions/side effects.   Please note  You were cared for by a hospitalist during your hospital stay. If you have any questions about your discharge medications or the care you received while you were in the hospital after you  are discharged, you can call the unit and asked to speak with the hospitalist on call if the hospitalist that took care of you is not available. Once you are discharged, your primary care physician will handle any further medical issues. Please note that NO REFILLS for any discharge medications will be authorized once you are discharged, as it is imperative that you return to your primary care physician (or establish a relationship with a primary care physician if you do not have one) for your aftercare needs so that they can reassess your need for medications and monitor your lab values.    On the day of Discharge:  VITAL SIGNS:  Blood pressure (!) 147/89, pulse 85, temperature 98.5 F (36.9 C), temperature source Oral, resp. rate 16, height 5\' 9"  (1.753 m), weight 267 lb (121.1 kg), last menstrual period 01/16/2017, SpO2 100 %. PHYSICAL EXAMINATION:  GENERAL:  46 y.o.-year-old patient lying in the bed with no acute distress.  EYES: Pupils equal, round, reactive to light and accommodation. No scleral icterus. Extraocular muscles intact.  HEENT: Head atraumatic, normocephalic. Oropharynx and nasopharynx clear.  NECK:  Supple, no jugular venous distention. No thyroid enlargement, no tenderness.  LUNGS: Normal breath sounds bilaterally, no wheezing, rales,rhonchi or crepitation. No use of accessory muscles of respiration.  CARDIOVASCULAR: S1, S2 normal. No murmurs, rubs, or gallops.  ABDOMEN: Soft, non-tender, non-distended. Bowel sounds present. No organomegaly or mass.  EXTREMITIES: No pedal edema, cyanosis, or clubbing.  NEUROLOGIC: Cranial nerves II through XII are intact. Muscle strength 5/5 in all extremities. Sensation intact. Gait not checked.  PSYCHIATRIC: The patient is alert and oriented x 3.  SKIN: No obvious rash, lesion, or ulcer.  DATA REVIEW:   CBC Recent Labs  Lab 01/31/17 1019  WBC 12.1*  HGB 9.7*  HCT 31.6*  PLT 323    Chemistries  Recent Labs  Lab 01/31/17 1019    NA 131*  K 4.8  CL 100*  CO2 22  GLUCOSE 448*  BUN 20  CREATININE 0.95  CALCIUM 8.7*     Microbiology Results  No results found for this or any previous visit.  RADIOLOGY:  No results found.   Management plans discussed with the patient, family and they are in agreement.  CODE STATUS: Full Code   TOTAL TIME TAKING CARE OF THIS PATIENT: 35 minutes.    Shaune PollackQing Isabelle Matt M.D on 02/01/2017 at 12:13 PM  Between 7am to 6pm - Pager - [phone]  After 6pm go to [email or web address] - Social research officer, governmentpassword EPAS [facility]  [facility] [place] [facility]  Office  [phone]  CC: Primary care physician; Patient, No Pcp Per   Note: This dictation was prepared with Dragon dictation along with smaller phrase technology. Any transcriptional errors that result from this process are unintentional.
